# Patient Record
Sex: Female | Born: 1960 | Race: White | Hispanic: No | Marital: Single | Smoking: Never smoker
Health system: Southern US, Community
[De-identification: ages and names within clinical notes are randomized; demographics above are authoritative.]

## PROBLEM LIST (undated history)

## (undated) DIAGNOSIS — M542 Cervicalgia: Secondary | ICD-10-CM

## (undated) DIAGNOSIS — M5136 Other intervertebral disc degeneration, lumbar region: Secondary | ICD-10-CM

## (undated) HISTORY — DX: Other intervertebral disc degeneration, lumbar region: M51.36

## (undated) HISTORY — PX: NO PAST SURGERIES: SHX2092

## (undated) HISTORY — DX: Cervicalgia: M54.2

---

## 2013-08-08 ENCOUNTER — Other Ambulatory Visit: Payer: Self-pay | Admitting: Family Medicine

## 2013-08-08 DIAGNOSIS — R160 Hepatomegaly, not elsewhere classified: Secondary | ICD-10-CM

## 2015-01-13 ENCOUNTER — Encounter (HOSPITAL_COMMUNITY): Payer: Self-pay | Admitting: Emergency Medicine

## 2015-01-13 ENCOUNTER — Emergency Department (HOSPITAL_COMMUNITY)
Admission: EM | Admit: 2015-01-13 | Discharge: 2015-01-13 | Disposition: A | Discharge status: Home / Self Care | Payer: PRIVATE HEALTH INSURANCE | Attending: Emergency Medicine | Admitting: Emergency Medicine

## 2015-01-13 DIAGNOSIS — M6281 Muscle weakness (generalized): Secondary | ICD-10-CM | POA: Diagnosis present

## 2015-01-13 DIAGNOSIS — R209 Unspecified disturbances of skin sensation: Secondary | ICD-10-CM

## 2015-01-13 DIAGNOSIS — M5412 Radiculopathy, cervical region: Secondary | ICD-10-CM

## 2015-01-13 DIAGNOSIS — R202 Paresthesia of skin: Secondary | ICD-10-CM | POA: Diagnosis not present

## 2015-01-13 MED ORDER — IBUPROFEN 400 MG PO TABS
600.0000 mg | ORAL_TABLET | Freq: Once | ORAL | Status: AC
  Administered 2015-01-13: 600 mg via ORAL
  Filled 2015-01-13: qty 1

## 2015-01-13 MED ORDER — IBUPROFEN 600 MG PO TABS: 600.0000 mg | ORAL_TABLET | Freq: Three times a day (TID) | ORAL | Status: DC | PRN

## 2015-01-13 MED ORDER — PREDNISONE 20 MG PO TABS
60.0000 mg | ORAL_TABLET | Freq: Once | ORAL | Status: AC
  Administered 2015-01-13: 60 mg via ORAL
  Filled 2015-01-13: qty 3

## 2015-01-13 MED ORDER — PREDNISONE 20 MG PO TABS: 60.0000 mg | ORAL_TABLET | Freq: Every day | ORAL | Status: DC

## 2015-01-13 NOTE — ED Provider Notes (Signed)
CSN: [id number]     Arrival date & time 01/13/15  0214 History   First MD Initiated Contact with Patient 01/13/15 0405     Chief Complaint  Patient presents with  . Weakness      HPI Patient complains of a numbness and tingling sensation of her left arm and left neck.  She reports discomfort and pain in the left shoulder region.  This started several days ago.  She denies new weakness of the left upper extremity.  She reports that she's had a abnormal sensation in her left cheek and left face over the past several months that she has not had a evaluated.  She feels as though there is an abnormal swelling near her left preauricular area.  No fevers or chills.  No recent injury or trauma to her neck.  She denies chest pain or shortness of breath.  No abdominal pain.   History reviewed. No pertinent past medical history. History reviewed. No pertinent past surgical history. History reviewed. No pertinent family history. Social History  Substance Use Topics  . Smoking status: Never Smoker   . Smokeless tobacco: None  . Alcohol Use: No   OB History    No data available     Review of Systems  All other systems reviewed and are negative.     Allergies  Shellfish allergy  Home Medications   Prior to Admission medications   Not on File   BP 114/79 mmHg  Pulse 85  Temp(Src) 97.8 F (36.6 C) (Oral)  Resp 22  Ht  (1.702 m)  Wt 145 lb (65.772 kg)  BMI 22.71 kg/m2  SpO2 98% Physical Exam  Constitutional: She is oriented to person, place, and time. She appears well-developed and well-nourished. No distress.  HENT:  Head: Normocephalic and atraumatic.  No trismus or malocclusion.  Dentition is normal.  No obvious swelling in her left parotid gland.  No swelling of the left preauricular space is noticeable on examination.  Bilateral TMs are normal.  Eyes: EOM are normal.  Neck: Normal range of motion. Neck supple.  C-spine is nontender  Cardiovascular: Normal rate,  regular rhythm and normal heart sounds.   Pulmonary/Chest: Effort normal and breath sounds normal.  Abdominal: Soft. She exhibits no distension. There is no tenderness.  Musculoskeletal: Normal range of motion.  Neurological: She is alert and oriented to person, place, and time.  Skin: Skin is warm and dry.  Psychiatric: She has a normal mood and affect. Judgment normal.  Nursing note and vitals reviewed.   ED Course  Procedures (including critical care time) Labs Review Labs Reviewed - No data to display  Imaging Review No results found. I have personally reviewed and evaluated these images and lab results as part of my medical decision-making.   EKG Interpretation None      MDM   Final diagnoses:  None    Patient is overall well-appearing.  This sounds like left-sided cervical radiculopathy as the cause of her left sided arm and neck symptoms.  No C-spine tenderness.  No history of IV drug abuse.  Outpatient primary care follow-up.  If her symptoms persist she may benefit from MRI.  No indication for emergent imaging tonight.  In regards to her left sided facial numbness.  She does feel abnormal near her left preauricular space but I don't not palpate or see anything abnormal.  I referred her to ENT for more thorough evaluation and history.  No indication for emergent workup in the  ER.    Azalia BilisKevin Alexee Delsanto, MD 01/14/15 [phone]

## 2015-01-13 NOTE — Discharge Instructions (Signed)

## 2015-01-13 NOTE — ED Notes (Signed)
Pt verbalized understanding of d/c instructions and has no further questions. Pt stable and NAD.  

## 2015-01-13 NOTE — ED Notes (Signed)
Patient here with complaint of left arm, face, and neck "weakness". States onset a few months ago. Also reports left shoulder/neck pain. No gross neuro deficits identified in triage. Patient ambulatory. Reports that discomfort was keeping her from sleeping tonight which prompted her to come "get checked tonight".

## 2015-01-21 ENCOUNTER — Ambulatory Visit
Admission: RE | Admit: 2015-01-21 | Discharge: 2015-01-21 | Disposition: A | Discharge status: Home / Self Care | Payer: PRIVATE HEALTH INSURANCE | Source: Ambulatory Visit | Attending: Family Medicine | Admitting: Family Medicine

## 2015-01-21 ENCOUNTER — Other Ambulatory Visit: Payer: Self-pay | Admitting: Family Medicine

## 2015-01-21 DIAGNOSIS — M5412 Radiculopathy, cervical region: Secondary | ICD-10-CM

## 2015-01-26 ENCOUNTER — Ambulatory Visit: Payer: Self-pay | Admitting: Neurology

## 2015-01-27 ENCOUNTER — Encounter: Payer: Self-pay | Admitting: Neurology

## 2015-01-27 ENCOUNTER — Ambulatory Visit (INDEPENDENT_AMBULATORY_CARE_PROVIDER_SITE_OTHER): Payer: PRIVATE HEALTH INSURANCE | Admitting: Neurology

## 2015-01-27 ENCOUNTER — Ambulatory Visit: Payer: Self-pay | Admitting: Neurology

## 2015-01-27 VITALS — BP 129/84 | HR 112 | Ht 67.0 in | Wt 125.0 lb

## 2015-01-27 DIAGNOSIS — R51 Headache: Secondary | ICD-10-CM

## 2015-01-27 DIAGNOSIS — R519 Headache, unspecified: Secondary | ICD-10-CM | POA: Insufficient documentation

## 2015-01-27 MED ORDER — MELOXICAM 7.5 MG PO TABS: 7.5000 mg | ORAL_TABLET | Freq: Two times a day (BID) | ORAL | Status: AC

## 2015-01-27 NOTE — Progress Notes (Signed)
PATIENT: Denise Walter DOB: 11-09-1960  Chief Complaint  Patient presents with  . Facial Sensitivity    Reports a "pulsating, pulling sensation" on the left side of her face, in her jaw area.  No pain, numbness or tingling in her jaw.  She sees a [facility] for neck pain.  She has never been evaluated by an oral [facility].     HISTORICAL  Denise Walter is a 55 years old right-handed female, accompanied by her mother, seen in refer by her primary care physician Dr. Christiane HaSharon Walter for evaluation of pulsating pulling sensation on the left side of her face   Since fall 2016, she noticed swelling and sensitivity of left mouth corner, which has been persistent since its onset, around December 2016, she also noticed left TMJ joints pain, especially with repetitive mouth opening and closing, chewing on hard food, she also reported left-sided neck pain, bilateral shoulder pain,  She had x-ray of her cervical region, showed mild abnormality, her neck and shoulder pain has much improved with chiropractor, but she continue have left TMJ joint discomfort, especially mouth open her mouth wide  She denied history of shingles, no rash break out  REVIEW OF SYSTEMS: Full 14 system review of systems performed and notable only for weakness  ALLERGIES: Allergies  Allergen Reactions  . Shellfish Allergy Hives    HOME MEDICATIONS: No current outpatient prescriptions on file.   No current facility-administered medications for this visit.    PAST MEDICAL HISTORY: Past Medical History  Diagnosis Date  . DDD (degenerative disc disease), lumbar   . Neck pain     Sees chiropractor    PAST SURGICAL HISTORY: Past Surgical History  Procedure Laterality Date  . No past surgeries      FAMILY HISTORY: Family History  Problem Relation Age of Onset  . Colon cancer Father   . Hypertension Mother     SOCIAL HISTORY:  Social History   Social History  . Marital Status: Single    Spouse Name: N/A  .  Number of Children: 2  . Years of Education: College   Occupational History  . Business Analyst    Social History Main Topics  . Smoking status: Never Smoker   . Smokeless tobacco: Not on file  . Alcohol Use: No  . Drug Use: No  . Sexual Activity: Yes    Birth Control/ Protection: None   Other Topics Concern  . Not on file   Social History Narrative   Right-handed.   No cafffeine use.   Lives at home with her mother and daughter.     PHYSICAL EXAM   Filed Vitals:   01/27/15 1054  BP: 129/84  Pulse: 112  Height: 5\' 7"  (1.702 m)  Weight: 125 lb (56.7 kg)    Not recorded      Body mass index is 19.57 kg/(m^2).  PHYSICAL EXAMNIATION:  Gen: NAD, conversant, well nourised, obese, well groomed                     Cardiovascular: Regular rate rhythm, no peripheral edema, warm, nontender. Eyes: Conjunctivae clear without exudates or hemorrhage Neck: Supple, no carotid bruise. Pulmonary: Clear to auscultation bilaterally  Musculoskeletal: Left jaw pain upon deep palpitation  NEUROLOGICAL EXAM:  MENTAL STATUS: Speech:    Speech is normal; fluent and spontaneous with normal comprehension.  Cognition:     Orientation to time, place and person     Normal recent and remote memory  Normal Attention span and concentration     Normal Language, naming, repeating,spontaneous speech     Fund of knowledge   CRANIAL NERVES: CN II: Visual fields are full to confrontation. Fundoscopic exam is normal with sharp discs and no vascular changes. Pupils are round equal and briskly reactive to light. CN III, IV, VI: extraocular movement are normal. No ptosis. CN V: Facial sensation is intact to pinprick in all 3 divisions bilaterally. Corneal responses are intact.  CN VII: Face is symmetric with normal eye closure and smile. CN VIII: Hearing is normal to rubbing fingers CN IX, X: Palate elevates symmetrically. Phonation is normal. CN XI: Head turning and shoulder shrug are  intact CN XII: Tongue is midline with normal movements and no atrophy.  MOTOR: There is no pronator drift of out-stretched arms. Muscle bulk and tone are normal. Muscle strength is normal.  REFLEXES: Reflexes are 2+ and symmetric at the biceps, triceps, knees, and ankles. Plantar responses are flexor.  SENSORY: Intact to light touch, pinprick, position sense, and vibration sense are intact in fingers and toes.  COORDINATION: Rapid alternating movements and fine finger movements are intact. There is no dysmetria on finger-to-nose and heel-knee-shin.    GAIT/STANCE: Posture is normal. Gait is steady with normal steps, base, arm swing, and turning. Heel and toe walking are normal. Tandem gait is normal.  Romberg is absent.   DIAGNOSTIC DATA (LABS, IMAGING, TESTING) - I reviewed patient records, labs, notes, testing and imaging myself where available.   ASSESSMENT AND PLAN  Denise Walter is a 55 y.o. female    Left facial pain,  Most consistent with left TMJ  I have written Mobic 7.5 twice a day as needed  Heating pad to left face  Denise Walter, M.D. Ph.D.  [facility] [address] [place], [place] [address] Ph: [phone] Fax: [phone]  CC: Denise Palmer, MD

## 2015-02-02 ENCOUNTER — Ambulatory Visit: Payer: Self-pay | Admitting: Neurology

## 2015-04-15 ENCOUNTER — Ambulatory Visit (INDEPENDENT_AMBULATORY_CARE_PROVIDER_SITE_OTHER): Payer: PRIVATE HEALTH INSURANCE | Admitting: Neurology

## 2015-04-15 ENCOUNTER — Encounter: Payer: Self-pay | Admitting: Neurology

## 2015-04-15 VITALS — BP 104/70 | HR 62 | Ht 67.0 in | Wt 129.0 lb

## 2015-04-15 DIAGNOSIS — R519 Headache, unspecified: Secondary | ICD-10-CM

## 2015-04-15 DIAGNOSIS — R51 Headache: Secondary | ICD-10-CM

## 2015-04-15 NOTE — Progress Notes (Signed)
Chief Complaint  Patient presents with  . Facial Pain    She felt meloxicam was some helpful.  She has been seen by a TMJ specialist and sent to [facility].  She completed therapy with them and she was given home exercises.  Says she has not been doing her home exercises on a regular basis and her pain has started again.  She has restarted meloxicam and has another pending appt with therapy.      PATIENT: Denise Walter Hopes DOB: [birth year]  Chief Complaint  Patient presents with  . Facial Pain    She felt meloxicam was some helpful.  She has been seen by a TMJ specialist and sent to [facility].  She completed therapy with them and she was given home exercises.  Says she has not been doing her home exercises on a regular basis and her pain has started again.  She has restarted meloxicam and has another pending appt with therapy.     HISTORICAL  Denise Walter is a 55 years old right-handed female, accompanied by her mother, seen in refer by her primary care physician Dr. Christiane HaSharon Wolter for evaluation of pulsating pulling sensation on the left side of her face   Since fall 2016, she noticed swelling and sensitivity of left mouth corner, which has been persistent since its onset, around December 2016, she also noticed left TMJ joints pain, especially with repetitive mouth opening and closing, chewing on hard food, she also reported left-sided neck pain, bilateral shoulder pain,  She had x-ray of her cervical region, showed mild abnormality, her neck and shoulder pain has much improved with chiropractor, but she continue have left TMJ joint discomfort, especially mouth open her mouth wide  She denied history of shingles, no rash break out  UPDATE March 29th 2017: She was seen by dentist for possible left TMJ, she underwent oral rehabilitation, and Mobic 7.5 milligrams twice a day for few weeks, with mild improvement, But she continue have mild left lower jaw discomfort, mild skin  sensitivity, denies dysarthria, no dysphagia or chewing difficulty, she is also concerned mild asymmetry of her left face, which has been present based on previous driver license picture   REVIEW OF SYSTEMS: Full 14 system review of systems performed and notable only for above  ALLERGIES: Allergies  Allergen Reactions  . Shellfish Allergy Hives    HOME MEDICATIONS: Current Outpatient Prescriptions  Medication Sig Dispense Refill  . meloxicam (MOBIC) 7.5 MG tablet Take 1 tablet (7.5 mg total) by mouth 2 (two) times daily. 60 tablet 6   No current facility-administered medications for this visit.    PAST MEDICAL HISTORY: Past Medical History  Diagnosis Date  . DDD (degenerative disc disease), lumbar   . Neck pain     Sees chiropractor    PAST SURGICAL HISTORY: Past Surgical History  Procedure Laterality Date  . No past surgeries      FAMILY HISTORY: Family History  Problem Relation Age of Onset  . Colon cancer Father   . Hypertension Mother     SOCIAL HISTORY:  Social History   Social History  . Marital Status: Single    Spouse Name: N/A  . Number of Children: 2  . Years of Education: College   Occupational History  . Business Analyst    Social History Main Topics  . Smoking status: Never Smoker   . Smokeless tobacco: Not on file  . Alcohol Use: No  . Drug Use: No  . Sexual Activity: Yes  Birth Control/ Protection: None   Other Topics Concern  . Not on file   Social History Narrative   Right-handed.   No cafffeine use.   Lives at home with her mother and daughter.     PHYSICAL EXAM   Filed Vitals:   04/15/15 1214  BP: 104/70  Pulse: 62  Height:  (1.702 m)  Weight: 129 lb (58.514 kg)    Not recorded      Body mass index is 20.2 kg/(m^2).  PHYSICAL EXAMNIATION:  Gen: NAD, conversant, well nourised, obese, well groomed                     Cardiovascular: Regular rate rhythm, no peripheral edema, warm, nontender. Eyes:  Conjunctivae clear without exudates or hemorrhage Neck: Supple, no carotid bruise. Pulmonary: Clear to auscultation bilaterally  Musculoskeletal: Left jaw discomfort upon deep palpitation  NEUROLOGICAL EXAM:  MENTAL STATUS: Speech:    Speech is normal; fluent and spontaneous with normal comprehension.  Cognition:     Orientation to time, place and person     Normal recent and remote memory     Normal Attention span and concentration     Normal Language, naming, repeating,spontaneous speech     Fund of knowledge   CRANIAL NERVES: CN II: Visual fields are full to confrontation. Fundoscopic exam is normal with sharp discs and no vascular changes. Pupils are round equal and briskly reactive to light. CN III, IV, VI: extraocular movement are normal. No ptosis. CN V: Facial sensation is intact to pinprick in all 3 divisions bilaterally. Corneal responses are intact.  CN VII: Face is symmetric with normal eye closure and smile. CN VIII: Hearing is normal to rubbing fingers CN IX, X: Palate elevates symmetrically. Phonation is normal. CN XI: Head turning and shoulder shrug are intact CN XII: Tongue is midline with normal movements and no atrophy.  MOTOR: There is no pronator drift of out-stretched arms. Muscle bulk and tone are normal. Muscle strength is normal.  REFLEXES: Reflexes are 2+ and symmetric at the biceps, triceps, knees, and ankles. Plantar responses are flexor.  SENSORY: Intact to light touch, pinprick, position sense, and vibration sense are intact in fingers and toes.  COORDINATION: Rapid alternating movements and fine finger movements are intact. There is no dysmetria on finger-to-nose and heel-knee-shin.    GAIT/STANCE: Posture is normal. Gait is steady with normal steps, base, arm swing, and turning. Heel and toe walking are normal. Tandem gait is normal.  Romberg is absent.   DIAGNOSTIC DATA (LABS, IMAGING, TESTING) - I reviewed patient records, labs, notes,  testing and imaging myself where available.   ASSESSMENT AND PLAN  Denise Walter is a 55 y.o. 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  Continue Mobic 7.5mg  bid prn, continue to work with dental specialist.  After discuss with patient, we decided to hold off imaging study such as MRI of brain at this point  Levert Feinstein, M.D. Ph.D.  [facility] [address], [address] [place], [place] [address] Ph: [phone] Fax: [phone]  CC: Mila Palmer, MD

## 2017-03-11 ENCOUNTER — Other Ambulatory Visit: Payer: Self-pay | Admitting: Chiropractor

## 2017-03-11 DIAGNOSIS — M542 Cervicalgia: Secondary | ICD-10-CM

## 2017-03-16 ENCOUNTER — Ambulatory Visit
Admission: RE | Admit: 2017-03-16 | Discharge: 2017-03-16 | Disposition: A | Discharge status: Home / Self Care | Payer: No Typology Code available for payment source | Source: Ambulatory Visit | Attending: Chiropractor | Admitting: Chiropractor

## 2017-03-16 DIAGNOSIS — M542 Cervicalgia: Secondary | ICD-10-CM

## 2017-06-14 ENCOUNTER — Other Ambulatory Visit: Payer: Self-pay

## 2017-06-14 DIAGNOSIS — Z1231 Encounter for screening mammogram for malignant neoplasm of breast: Secondary | ICD-10-CM

## 2017-06-15 ENCOUNTER — Other Ambulatory Visit: Payer: Self-pay | Admitting: Plastic Surgery

## 2017-06-15 DIAGNOSIS — Z1231 Encounter for screening mammogram for malignant neoplasm of breast: Secondary | ICD-10-CM

## 2017-06-27 ENCOUNTER — Ambulatory Visit
Admission: RE | Admit: 2017-06-27 | Discharge: 2017-06-27 | Disposition: A | Discharge status: Home / Self Care | Payer: No Typology Code available for payment source | Source: Ambulatory Visit | Attending: Plastic Surgery | Admitting: Plastic Surgery

## 2017-06-27 ENCOUNTER — Other Ambulatory Visit: Payer: Self-pay | Admitting: Plastic Surgery

## 2017-06-27 DIAGNOSIS — Z01811 Encounter for preprocedural respiratory examination: Secondary | ICD-10-CM

## 2019-08-19 IMAGING — CR DG CHEST 2V
2 series · 2 of 2 positions shown · non-contrast
Comparison: None.

CLINICAL DATA: Pre-op respiratory exam for breast augmentation.

EXAM:
CHEST - 2 VIEW

[w chest pa]
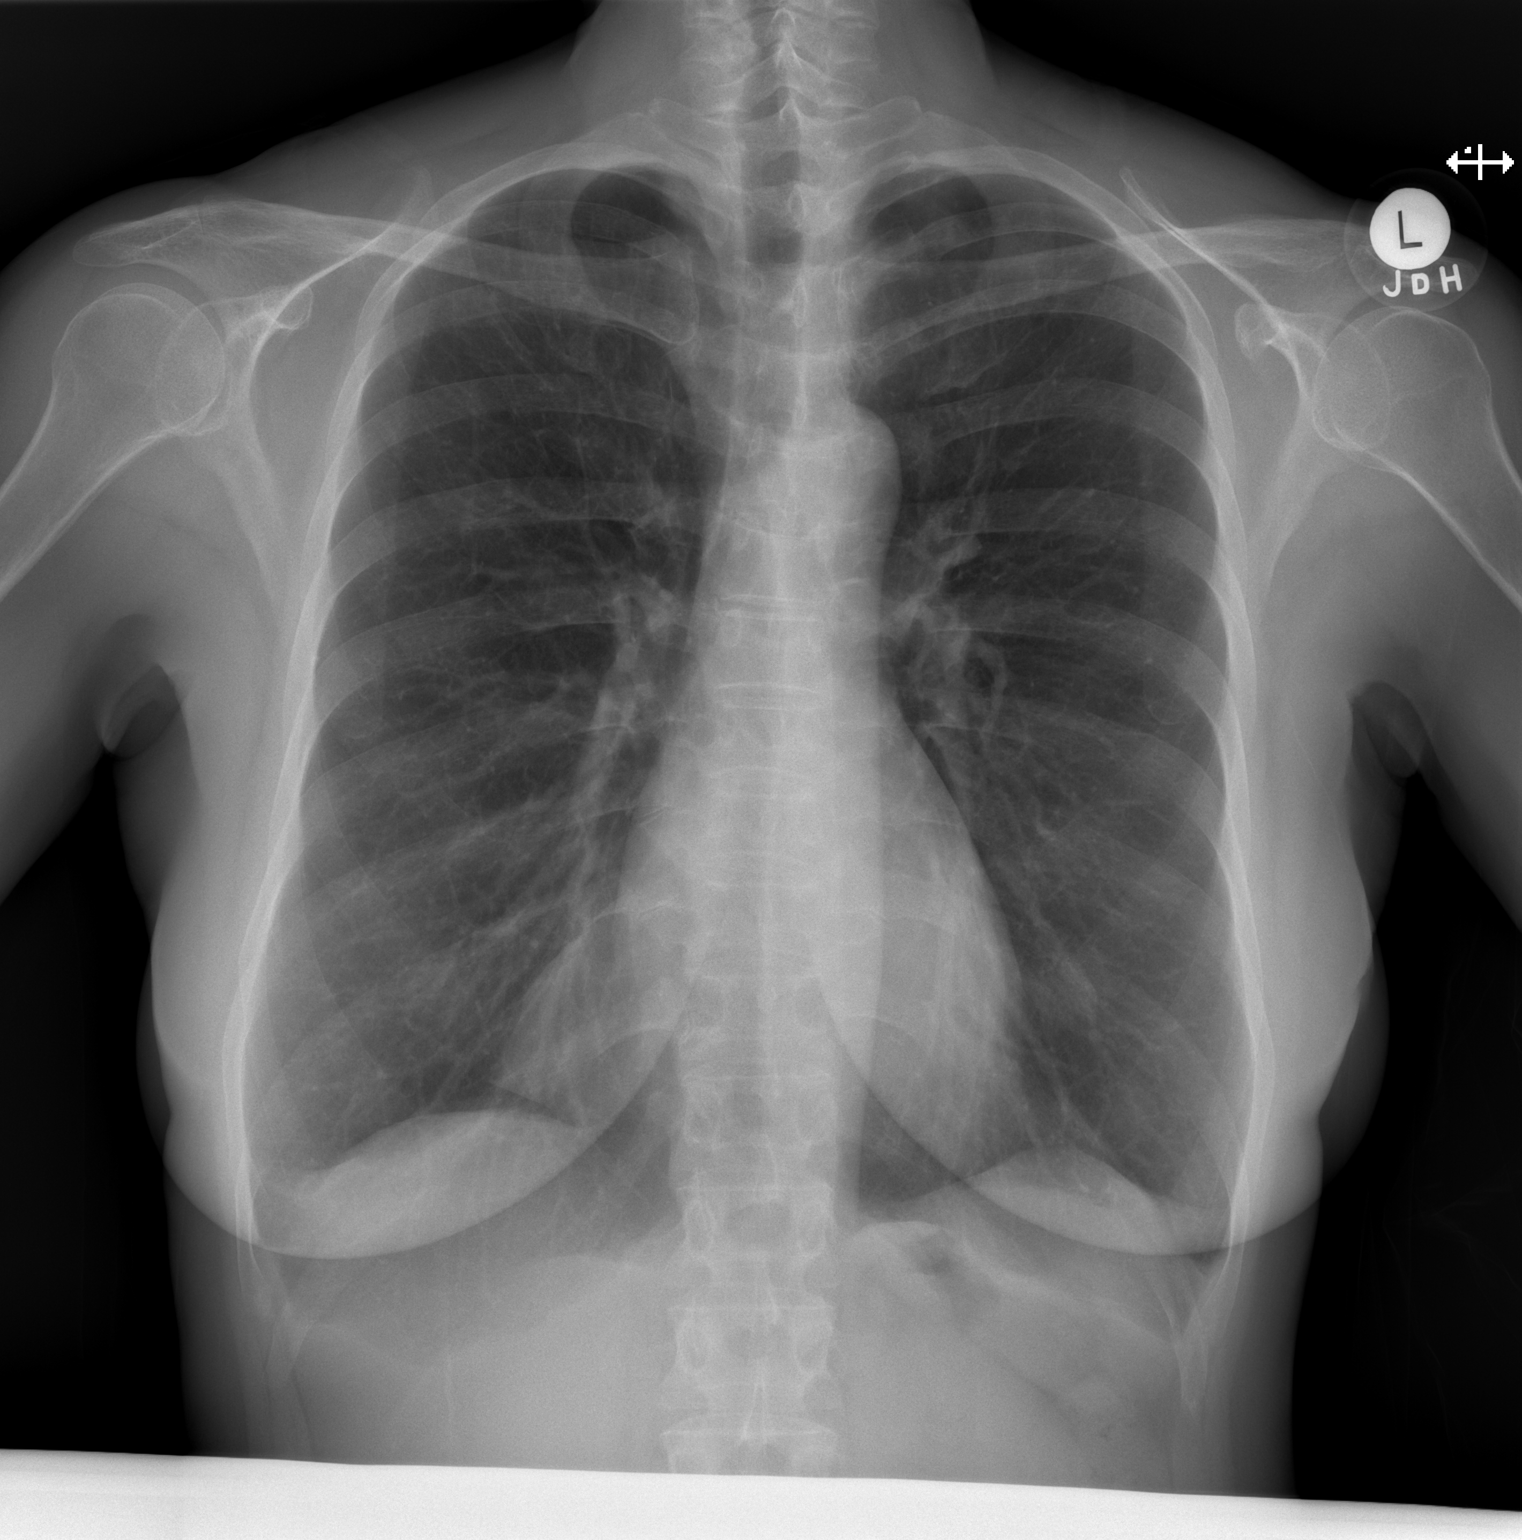

[w chest lat]
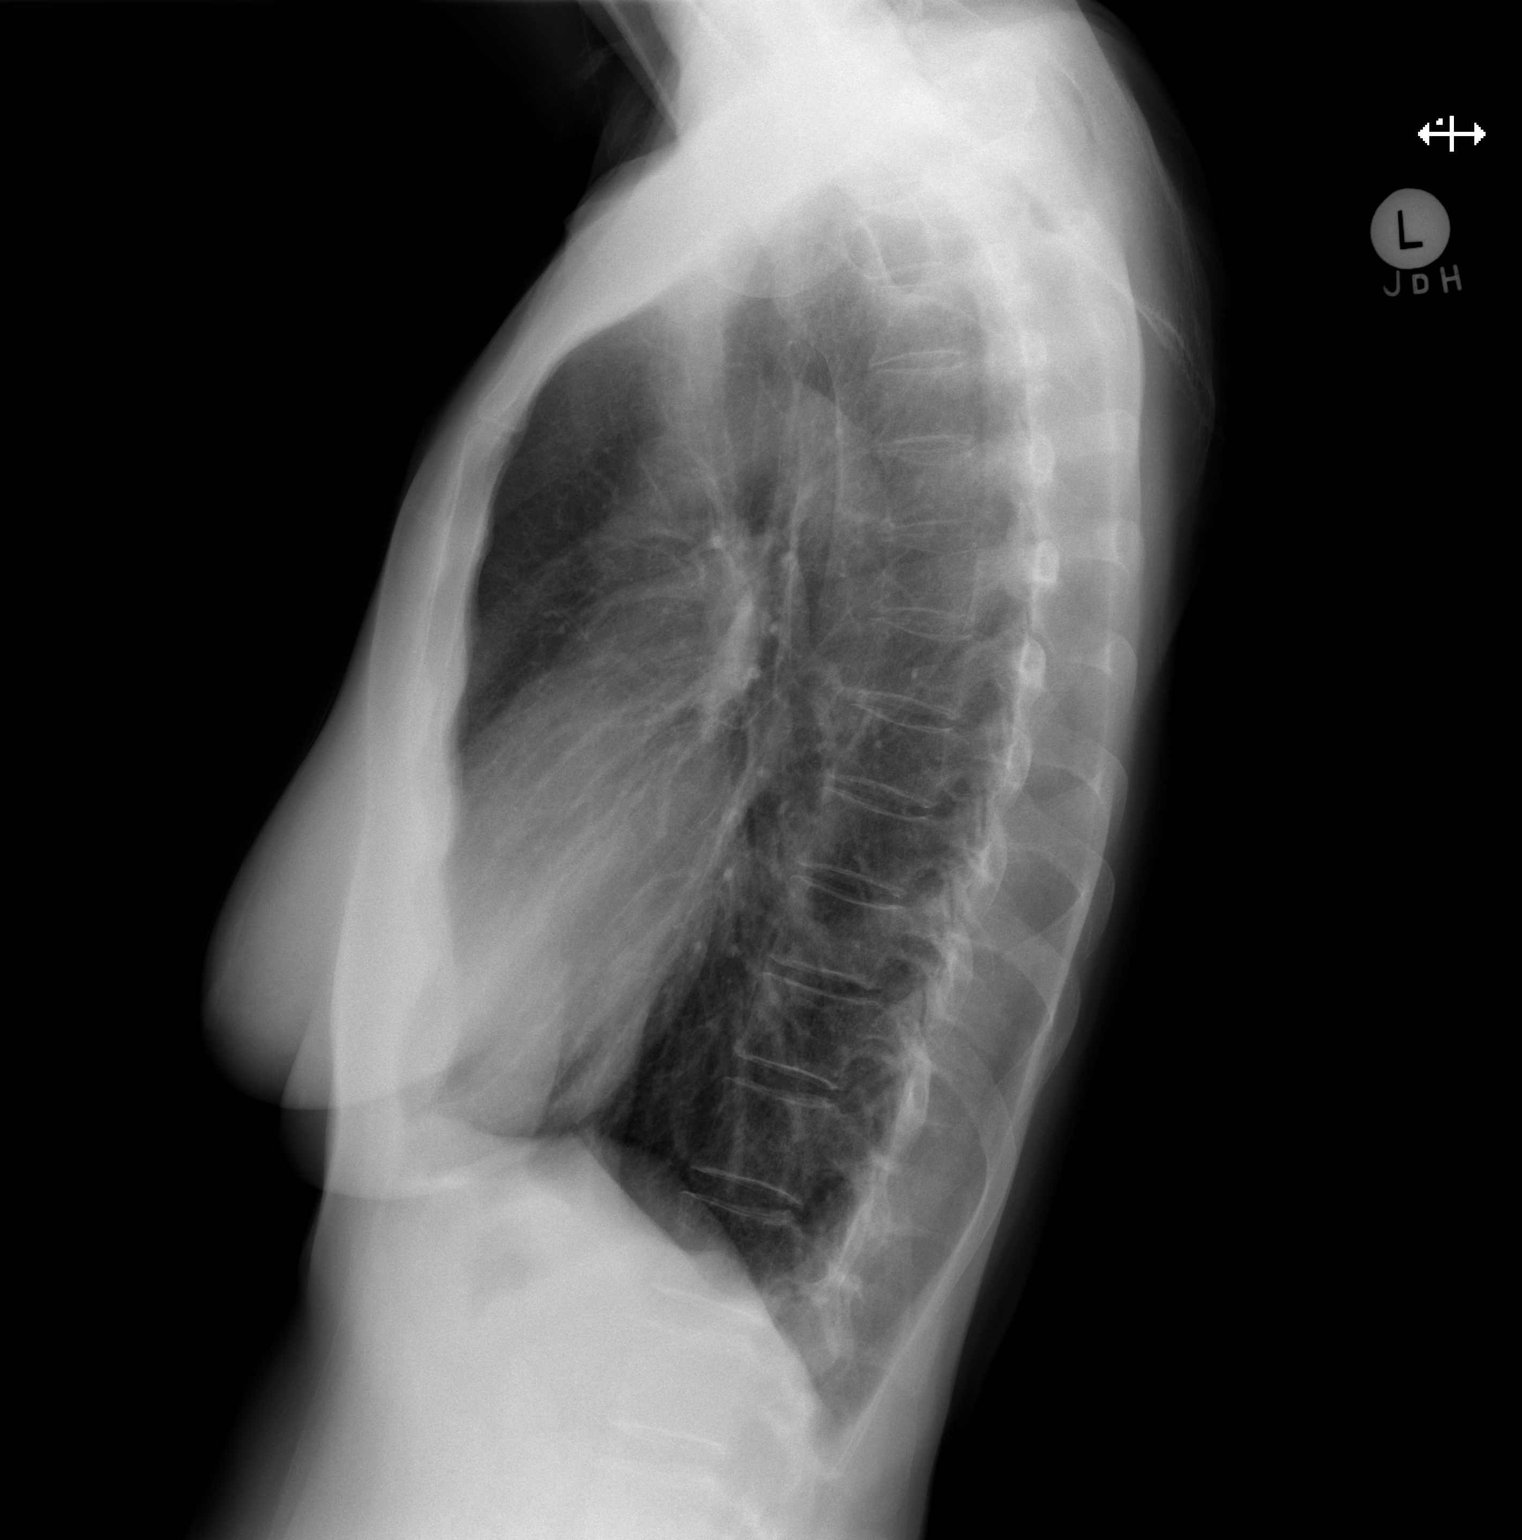

[2 of 2 positions shown; findings below may reference images not displayed]

FINDINGS: The heart size and mediastinal contours are within normal limits.
Both lungs are clear. The visualized skeletal structures are
unremarkable.
IMPRESSION: Negative.  No active cardiopulmonary disease.
